# Patient Record
Sex: Male | Born: 1965 | Race: Black or African American | Hispanic: No | Marital: Single | Smoking: Never smoker
Health system: Southern US, Community
[De-identification: ages and names within clinical notes are randomized; demographics above are authoritative.]

---

## 2020-12-12 ENCOUNTER — Emergency Department (HOSPITAL_COMMUNITY): Payer: BLUE CROSS/BLUE SHIELD

## 2020-12-12 ENCOUNTER — Encounter (HOSPITAL_COMMUNITY): Payer: Self-pay

## 2020-12-12 ENCOUNTER — Emergency Department (HOSPITAL_COMMUNITY)
Admission: EM | Admit: 2020-12-12 | Discharge: 2020-12-12 | Disposition: A | Discharge status: Home / Self Care | Payer: BLUE CROSS/BLUE SHIELD | Attending: Emergency Medicine | Admitting: Emergency Medicine

## 2020-12-12 DIAGNOSIS — R519 Headache, unspecified: Secondary | ICD-10-CM | POA: Insufficient documentation

## 2020-12-12 LAB — CBC WITH DIFFERENTIAL/PLATELET
Abs Immature Granulocytes: 0 10*3/uL (ref 0.00–0.07)
Basophils Absolute: 0 10*3/uL (ref 0.0–0.1)
Basophils Relative: 1 %
Eosinophils Absolute: 0.1 10*3/uL (ref 0.0–0.5)
Eosinophils Relative: 4 %
HCT: 46.8 % (ref 39.0–52.0)
Hemoglobin: 16 g/dL (ref 13.0–17.0)
Immature Granulocytes: 0 %
Lymphocytes Relative: 53 %
Lymphs Abs: 1.8 10*3/uL (ref 0.7–4.0)
MCH: 30.4 pg (ref 26.0–34.0)
MCHC: 34.2 g/dL (ref 30.0–36.0)
MCV: 88.8 fL (ref 80.0–100.0)
Monocytes Absolute: 0.3 10*3/uL (ref 0.1–1.0)
Monocytes Relative: 9 %
Neutro Abs: 1.1 10*3/uL — ABNORMAL LOW (ref 1.7–7.7)
Neutrophils Relative %: 33 %
Platelets: 238 10*3/uL (ref 150–400)
RBC: 5.27 MIL/uL (ref 4.22–5.81)
RDW: 12.9 % (ref 11.5–15.5)
WBC: 3.4 10*3/uL — ABNORMAL LOW (ref 4.0–10.5)
nRBC: 0 % (ref 0.0–0.2)

## 2020-12-12 LAB — BASIC METABOLIC PANEL
Anion gap: 6 (ref 5–15)
BUN: 14 mg/dL (ref 6–20)
CO2: 27 mmol/L (ref 22–32)
Calcium: 9 mg/dL (ref 8.9–10.3)
Chloride: 104 mmol/L (ref 98–111)
Creatinine, Ser: 1.33 mg/dL — ABNORMAL HIGH (ref 0.61–1.24)
GFR, Estimated: >60 mL/min (ref >60)
Glucose, Bld: 98 mg/dL (ref 70–99)
Potassium: 3.7 mmol/L (ref 3.5–5.1)
Sodium: 137 mmol/L (ref 135–145)

## 2020-12-12 MED ORDER — KETOROLAC TROMETHAMINE 30 MG/ML IJ SOLN
30.0000 mg | Freq: Once | INTRAMUSCULAR | Status: AC
Start: 2020-12-12 — End: 2020-12-12
  Administered 2020-12-12: 30 mg via INTRAMUSCULAR
  Filled 2020-12-12: qty 1

## 2020-12-12 NOTE — ED Provider Notes (Signed)
[facility]-EMERGENCY DEPT Provider Note   CSN: [id number] Arrival date & time: 12/12/20  0544     History Chief Complaint  Patient presents with   Facial Pain    Jorge Barnes is a 55 y.o. male.  Patient has no notable past medical history.  Patient presents with 1 day of left-sided facial pain.  Pain is present in the left cheek and extends into the left occipital lobe.  He says it is gradually worsened since it started.  Is very sensitive to the touch as well as cold.  Describes it as throbbing.  Nothing is made it better or worse.  Never had this pain before.  Denies any vision changes, ear pain, congestion, sore throat, difficulty breathing, chest pains.  Have a history of migraines.  He does state he has been under a lot of stress because he is traveling for his work.  He is from [place].  HPI     History reviewed. No pertinent past medical history.  There are no problems to display for this patient.   History reviewed. No pertinent surgical history.     History reviewed. No pertinent family history.  Social History   Tobacco Use   Smoking status: Never   Smokeless tobacco: Never    Home Medications Prior to Admission medications   Medication Sig Start Date End Date Taking? Authorizing Provider  ASPIRIN PO Take 2 tablets by mouth 2 (two) times daily as needed (headache).   Yes [provider]  diphenhydramine-acetaminophen (TYLENOL PM) 25-500 MG TABS tablet Take 2 tablets by mouth at bedtime.   Yes [provider]  Glycerin-Hypromellose-PEG 400 (VISINE DRY EYE OP) Place 1 drop into both eyes as needed (dry eyes).   Yes [provider]  Misc Natural Products (IMMUNE FORMULA PO) Take 2 tablets by mouth daily.   Yes [provider]  Multiple Vitamin (MULTIVITAMIN) tablet Take 2 tablets by mouth daily.   Yes [provider]  Omega-3 Fatty Acids (FISH OIL PO) Take 1 capsule by mouth daily.   Yes  [provider]  Red Yeast Rice Extract (RED YEAST RICE PO) Take 2 tablets by mouth daily.   Yes [provider]    Allergies    Patient has no known allergies.  Review of Systems   Review of Systems  Constitutional:  Negative for chills and fever.  HENT:  Negative for congestion, dental problem, ear discharge, ear pain, facial swelling, rhinorrhea, sinus pressure, sinus pain, sore throat, tinnitus, trouble swallowing and voice change.        Left-sided facial pain  Eyes:  Negative for visual disturbance.  Respiratory:  Negative for cough, chest tightness and shortness of breath.   Cardiovascular:  Negative for chest pain, palpitations and leg swelling.  Gastrointestinal:  Negative for abdominal pain, blood in stool, constipation, diarrhea, nausea and vomiting.  Genitourinary:  Negative for dysuria, flank pain and hematuria.  Musculoskeletal:  Negative for back pain.  Skin:  Negative for rash and wound.  Neurological:  Positive for headaches. Negative for dizziness, syncope, speech difficulty, weakness, light-headedness and numbness.  Psychiatric/Behavioral:  Negative for confusion.   All other systems reviewed and are negative.  Physical Exam Updated Vital Signs BP (!) 145/108 (BP Location: Left Arm)   Pulse 73   Temp 98.1 F (36.7 C) (Oral)   Resp 16   SpO2 98%   Physical Exam Vitals and nursing note reviewed.  Constitutional:      General: He  is not in acute distress.    Appearance: Normal appearance. He is not ill-appearing, toxic-appearing or diaphoretic.  HENT:     Head: Normocephalic and atraumatic.     Jaw: No trismus, tenderness or pain on movement.     Comments: Tenderness to palpation of left face and left occipital region.    Right Ear: Tympanic membrane normal. No decreased hearing noted. No drainage or tenderness. There is no impacted cerumen. No mastoid tenderness. Tympanic membrane is not perforated or erythematous.     Left Ear: Tympanic  membrane normal. No decreased hearing noted. No drainage or tenderness. There is no impacted cerumen. No mastoid tenderness. Tympanic membrane is not perforated or erythematous.     Nose: Nose normal. No nasal deformity, congestion or rhinorrhea.     Mouth/Throat:     Lips: Pink. No lesions.     Mouth: Mucous membranes are moist. No injury, lacerations, oral lesions or angioedema.     Dentition: Normal dentition.     Pharynx: Oropharynx is clear. Uvula midline. No pharyngeal swelling, oropharyngeal exudate, posterior oropharyngeal erythema or uvula swelling.     Tonsils: No tonsillar exudate or tonsillar abscesses.  Eyes:     General: Gaze aligned appropriately. No visual field deficit or scleral icterus.       Right eye: No discharge.        Left eye: No discharge.     Extraocular Movements: Extraocular movements intact.     Conjunctiva/sclera: Conjunctivae normal.     Right eye: Right conjunctiva is not injected. No exudate or hemorrhage.    Left eye: Left conjunctiva is not injected. No exudate or hemorrhage.    Pupils: Pupils are equal, round, and reactive to light.  Cardiovascular:     Rate and Rhythm: Normal rate and regular rhythm.     Pulses: Normal pulses.          Radial pulses are 2+ on the right side and 2+ on the left side.       Dorsalis pedis pulses are 2+ on the right side and 2+ on the left side.     Heart sounds: Normal heart sounds, S1 normal and S2 normal. Heart sounds not distant. No murmur heard.   No friction rub. No gallop. No S3 or S4 sounds.  Pulmonary:     Effort: Pulmonary effort is normal. No accessory muscle usage or respiratory distress.     Breath sounds: Normal breath sounds. No stridor. No wheezing, rhonchi or rales.  Chest:     Chest wall: No tenderness.  Abdominal:     General: Abdomen is flat. Bowel sounds are normal. There is no distension.     Palpations: Abdomen is soft. There is no mass or pulsatile mass.     Tenderness: There is no abdominal  tenderness. There is no guarding or rebound.  Musculoskeletal:     Cervical back: Normal range of motion and neck supple. No tenderness. No muscular tenderness.     Right lower leg: No edema.     Left lower leg: No edema.  Lymphadenopathy:     Cervical: No cervical adenopathy.  Skin:    General: Skin is warm and dry.     Coloration: Skin is not jaundiced or pale.     Findings: No bruising, erythema, lesion or rash.  Neurological:     General: No focal deficit present.     Mental Status: He is alert and oriented to person, place, and time.     GCS:  GCS eye subscore is 4. GCS verbal subscore is 5. GCS motor subscore is 6.     Cranial Nerves: Cranial nerves are intact. No cranial nerve deficit, dysarthria or facial asymmetry.     Sensory: Sensation is intact.     Motor: Motor function is intact.  Psychiatric:        Mood and Affect: Mood normal.        Behavior: Behavior normal. Behavior is cooperative.    ED Results / Procedures / Treatments   Labs (all labs ordered are listed, but only abnormal results are displayed) Labs Reviewed  BASIC METABOLIC PANEL - Abnormal; Notable for the following components:      Result Value   Creatinine, Ser 1.33 (*)    All other components within normal limits  CBC WITH DIFFERENTIAL/PLATELET - Abnormal; Notable for the following components:   WBC 3.4 (*)    Neutro Abs 1.1 (*)    All other components within normal limits    EKG None  Radiology CT Head Wo Contrast  Result Date: 12/12/2020 CLINICAL DATA:  55 year old male with history of left face tingling for the past 2 days. EXAM: CT HEAD WITHOUT CONTRAST TECHNIQUE: Contiguous axial images were obtained from the base of the skull through the vertex without intravenous contrast. COMPARISON:  No priors. FINDINGS: Brain: No evidence of acute infarction, hemorrhage, hydrocephalus, extra-axial collection or mass lesion/mass effect. Vascular: No hyperdense vessel or unexpected calcification. Skull:  Normal. Negative for fracture or focal lesion. Sinuses/Orbits: No acute finding. Mild thickening of the mucosa in the right maxillary sinus. Other: None. IMPRESSION: 1. No acute intracranial abnormalities. 2. The appearance of the brain is normal. Electronically Signed   By: Trudie Reed M.D.   On: 12/12/2020 07:43    Procedures Procedures   Medications Ordered in ED Medications  ketorolac (TORADOL) 30 MG/ML injection 30 mg (30 mg Intramuscular Given 12/12/20 0830)    ED Course  I have reviewed the triage vital signs and the nursing notes.  Pertinent labs & imaging results that were available during my care of the patient were reviewed by me and considered in my medical decision making (see chart for details).    MDM Rules/Calculators/A&P                         Is a well-appearing 55 year old male presents emergency department with chief complaint of left-sided facial pain that extends into his left occipital lobe.  He is afebrile and his vitals are stable.  Initial labs and imaging were obtained in triage. Reviewed all labs and imaging.  Creatinine is mildly increased at 1.33, but do not have baseline to compare it to.  No leukocytosis noted.  No anemia.  No other abnormalities noted.  CT head with no acute abnormalities.  Exam with no evidence of ear infection.  No vision changes or eye involvement.  No evidence of dental caries or abscess.  No evidence of pharyngitis.  No evidence of sinus congestion.  No facial swelling.  Pain is not in the trigeminal distribution.  Due to no obvious or alternative cause of patient's pain, patient likely is having tension headache due to increased stress to have recent travel.  Will trial on dose of Toradol.  Reevaluation, patient is feeling a little bit better.  He feels okay for discharge.  His vitals remained stable throughout his time in the ED.  Discharge paperwork placed.  Patient can take Tylenol and ibuprofen at home for  his pain.  He should  follow-up with his PCP when he gets home from his work trip.  I spoke with Dr. Effie Shy regarding the care of this patient and he agrees with the treatment and plan.   Final Clinical Impression(s) / ED Diagnoses Final diagnoses:  Facial pain    Rx / DC Orders ED Discharge Orders     None        Claudie Leach, PA-C 12/12/20 2449    Mancel Bale, MD 12/13/20 [phone]

## 2020-12-12 NOTE — Discharge Instructions (Addendum)
Seen in the emergency department for left facial pain.  We did not find any emergent causes to your pain.  You can treat your pain with ibuprofen and Tylenol at home.  Please follow-up with your PCP when you get back into [place].

## 2020-12-12 NOTE — ED Triage Notes (Addendum)
Pt arrived via POV, c/o left sided facial pain. States pain in left temple, worse with palpation, but endorsing "tingling" down left side of face that started yesterday morning.

## 2020-12-12 NOTE — ED Provider Notes (Signed)
Emergency Medicine Provider Triage Evaluation Note  Jorge Barnes , a 55 y.o. male  was evaluated in triage.  Pt complains of tingling sensation to the left side of his face that he noticed yesterday morning upon waking up. LKN 2 days ago. He complains of increased pain/irritation to that side. No specific headache. No hx of migraines. Has been taking OTC meds with mild relief. Denies fevers, chills, rash, neck stiffness, speech changes, unilateral weakness or numbness.   Review of Systems  Positive: + tingling to left side of face Negative: - neuro complaints  Physical Exam  BP (!) 161/110 (BP Location: Left Arm)   Pulse 90   Temp 98.1 F (36.7 C) (Oral)   Resp 18   SpO2 99%  Gen:   Awake, no distress   Resp:  Normal effort  MSK:   Moves extremities without difficulty  Other:  CN 2-12 intact. No facial droop. Speech clear. Normal finger to nose. Equal strength throughout BUE and BLEs.   Medical Decision Making  Medically screening exam initiated at 6:13 AM.  Appropriate orders placed.  Jorge Barnes was informed that the remainder of the evaluation will be completed by another provider, this initial triage assessment does not replace that evaluation, and the importance of remaining in the ED until their evaluation is complete.  Questionable trigeminal neuralgia?    Tanda Rockers, PA-C 12/12/20 8144    Geoffery Lyons, MD 12/12/20 [phone]

## 2022-10-14 IMAGING — CT CT HEAD W/O CM
3 series · 16 of 47 positions shown, 19 images · non-contrast
Comparison: No priors.

CLINICAL DATA: 55-year-old male with history of left face tingling
for the past 2 days.

EXAM:
CT HEAD WITHOUT CONTRAST
TECHNIQUE: Contiguous axial images were obtained from the base of the skull
through the vertex without intravenous contrast.

[Series 2: head wo · axial · 0.47mm/px · z∈[-91,+39]mm · 10 of 32 slices shown, 13 images]
[im 3/32  brain]
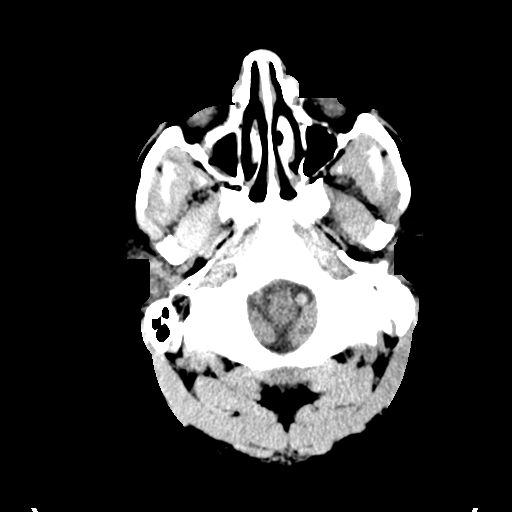
[im 3/32  bone]
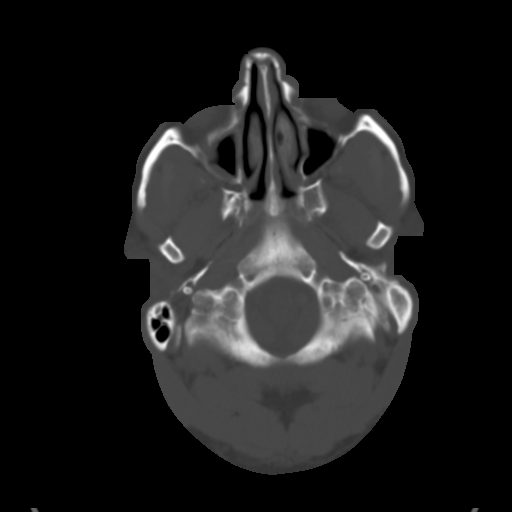
[im 6/32  brain]
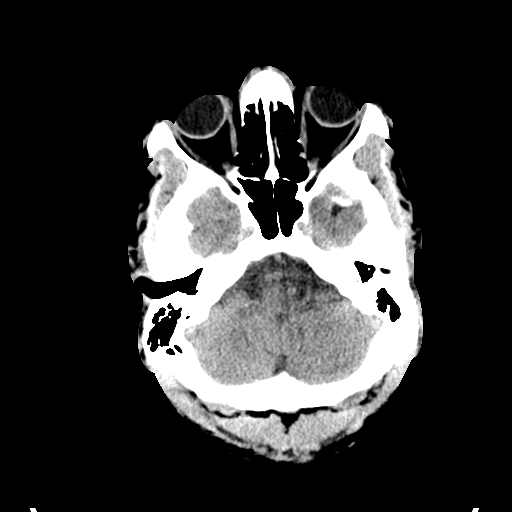
[im 9/32  brain]
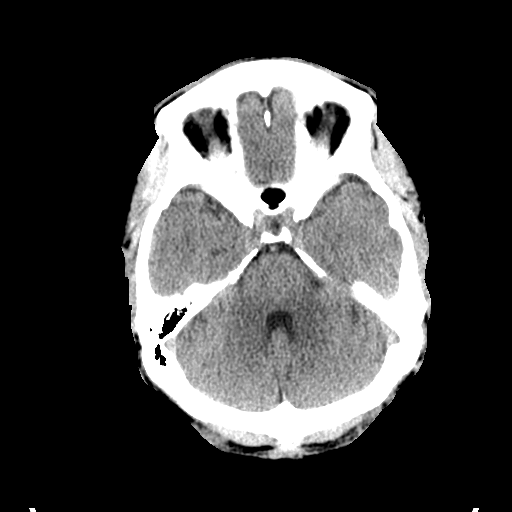
[im 11/32  brain]
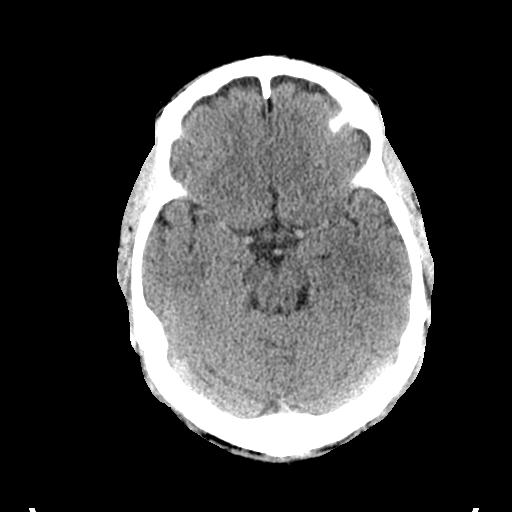
[im 14/32  brain]
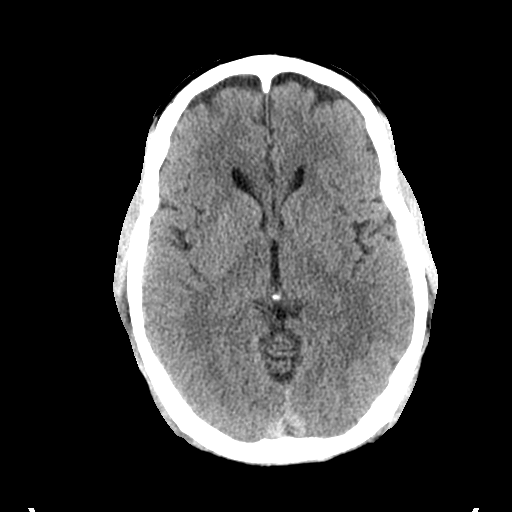
[im 14/32  bone]
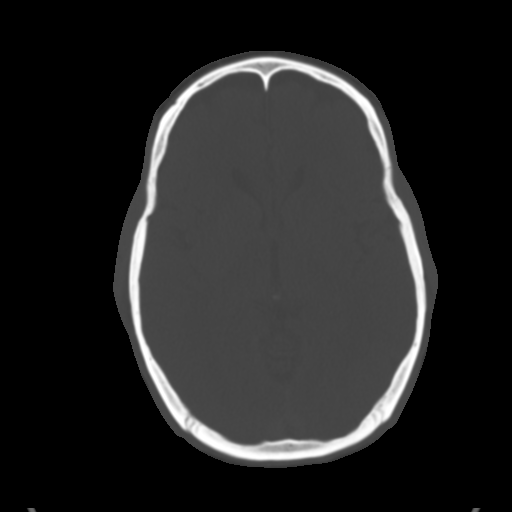
[im 18/32  brain]
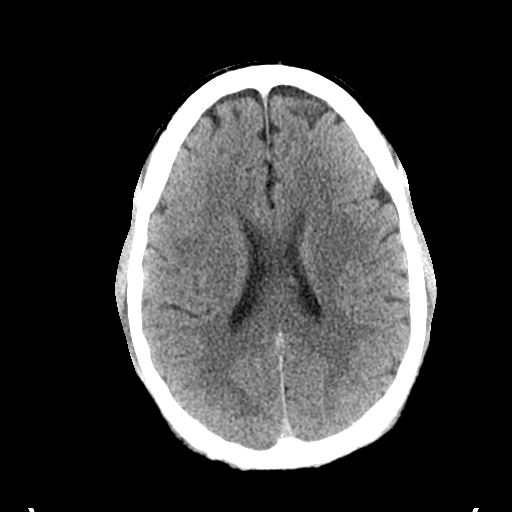
[im 21/32  brain]
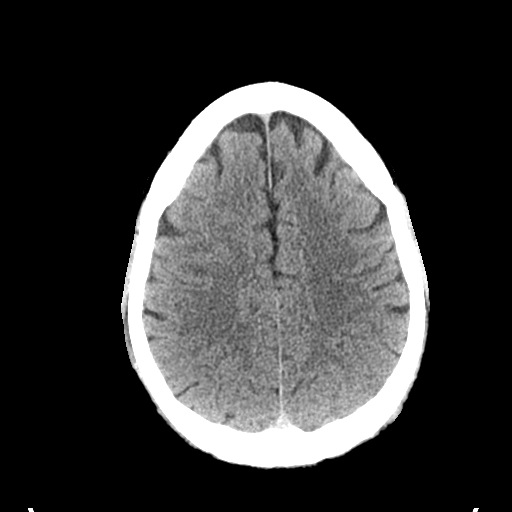
[im 24/32  brain]
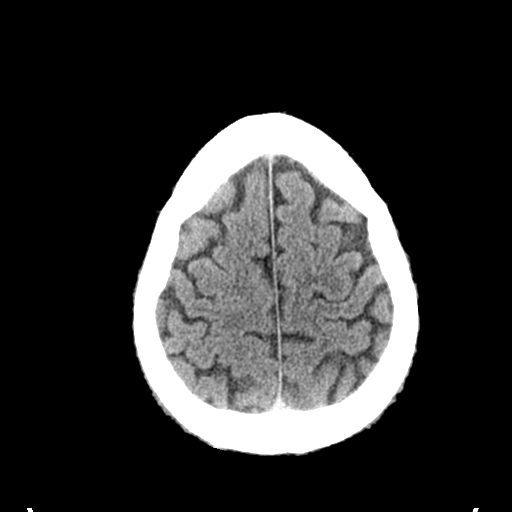
[im 26/32  brain]
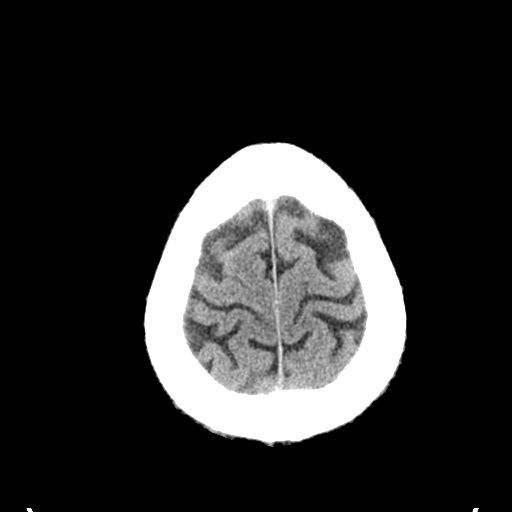
[im 26/32  bone]
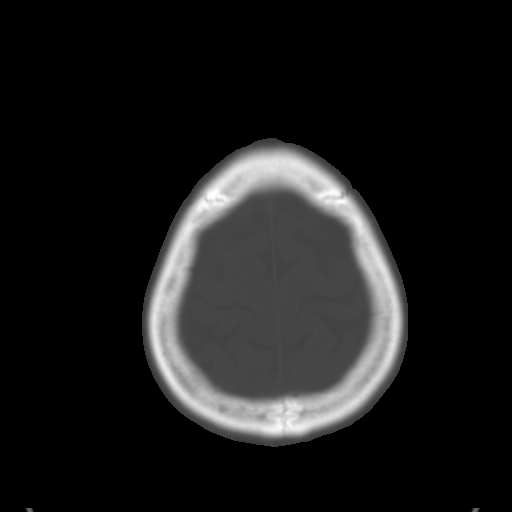
[im 29/32  brain]
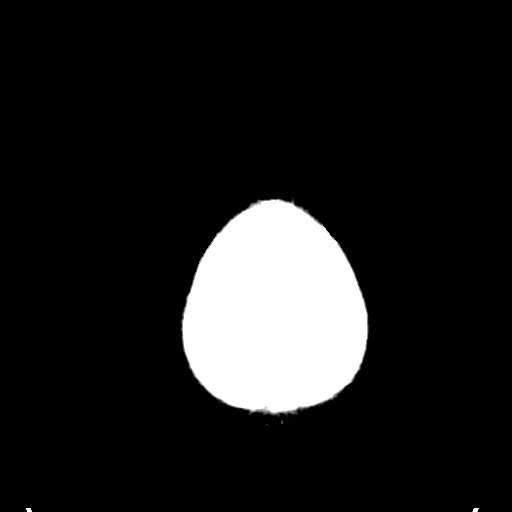

[Series 4: coronal soft tissue · coronal · 0.31mm/px · 3 of 68 slices shown]
[im 23/68  brain]
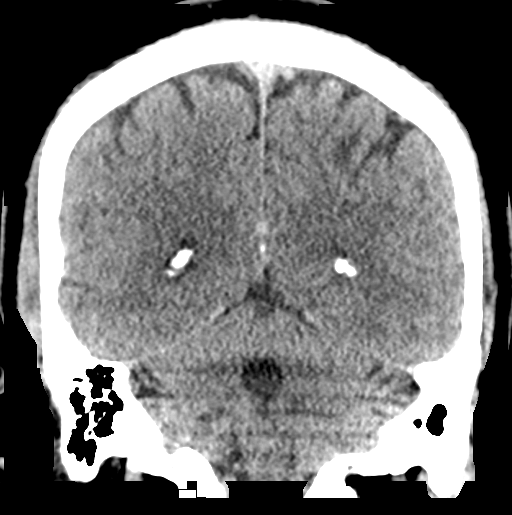
[im 30/68  brain]
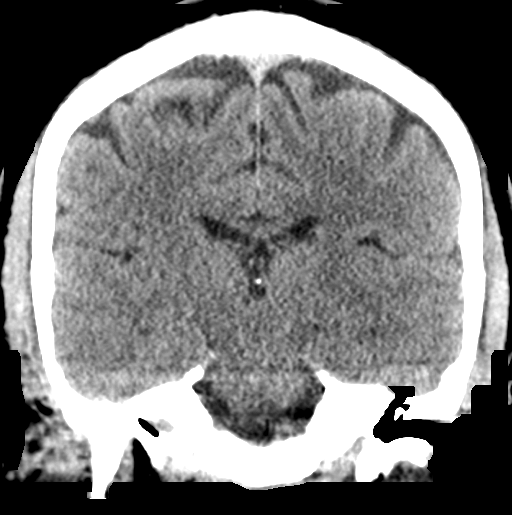
[im 38/68  brain]
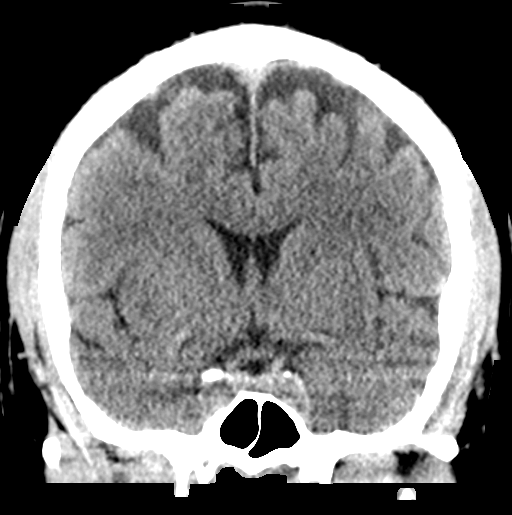

[Series 5: sagittal soft tissue · sagittal · 0.31mm/px · 3 of 53 slices shown]
[im 18/53  brain]
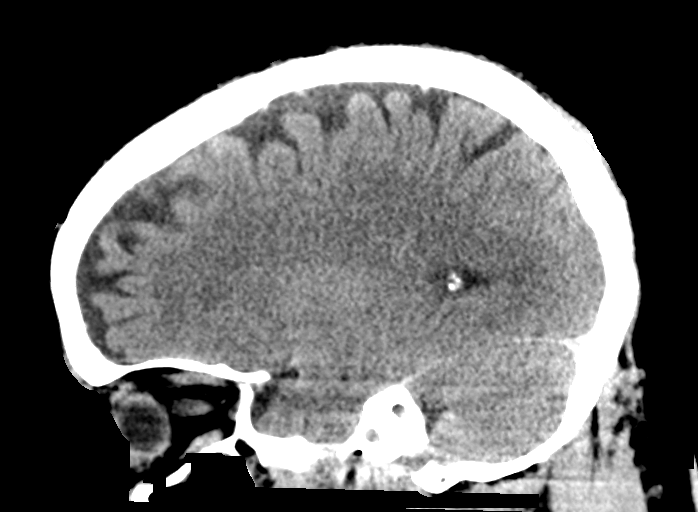
[im 27/53  brain]
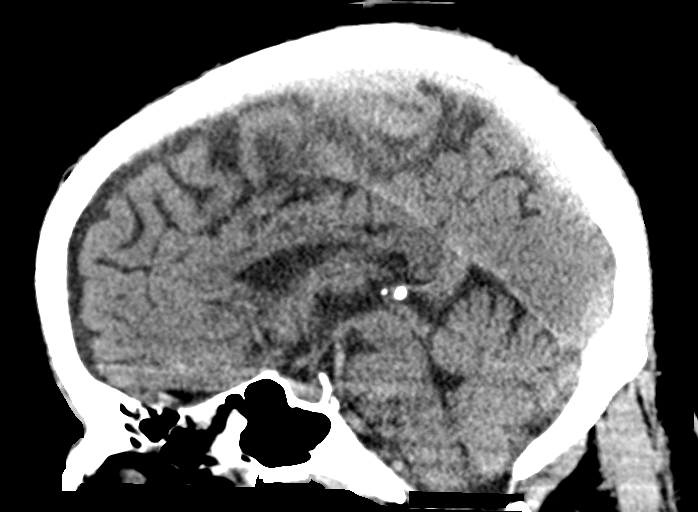
[im 35/53  brain]
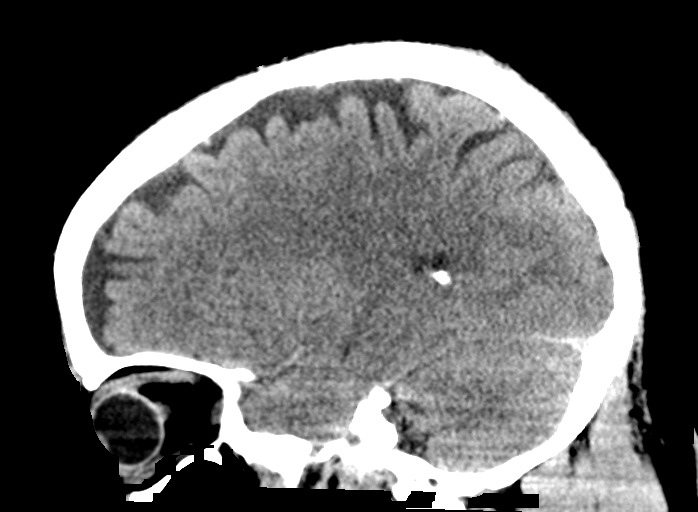

[16 of 47 positions shown; findings below may reference images not displayed]

FINDINGS: Brain: No evidence of acute infarction, hemorrhage, hydrocephalus,
extra-axial collection or mass lesion/mass effect.

Vascular: No hyperdense vessel or unexpected calcification.

Skull: Normal. Negative for fracture or focal lesion.

Sinuses/Orbits: No acute finding. Mild thickening of the mucosa in
the right maxillary sinus.

Other: None.
IMPRESSION: 1. No acute intracranial abnormalities.
2. The appearance of the brain is normal.
